# Patient Record
Sex: Male | Born: 2000 | Race: Black or African American | Hispanic: No | Marital: Single | Smoking: Never smoker
Health system: Southern US, Community
[De-identification: ages and names within clinical notes are randomized; demographics above are authoritative.]

---
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
## 2022-06-10 ENCOUNTER — Other Ambulatory Visit: Payer: Self-pay

## 2022-06-10 ENCOUNTER — Ambulatory Visit
Admission: EM | Admit: 2022-06-10 | Discharge: 2022-06-10 | Disposition: A | Discharge status: Home / Self Care | Payer: Medicaid Other | Attending: Nurse Practitioner | Admitting: Nurse Practitioner

## 2022-06-10 ENCOUNTER — Encounter: Payer: Self-pay | Admitting: Emergency Medicine

## 2022-06-10 DIAGNOSIS — Z1152 Encounter for screening for COVID-19: Secondary | ICD-10-CM | POA: Diagnosis present

## 2022-06-10 DIAGNOSIS — B349 Viral infection, unspecified: Secondary | ICD-10-CM | POA: Diagnosis not present

## 2022-06-10 LAB — POCT INFLUENZA A/B
Influenza A, POC: NEGATIVE
Influenza B, POC: NEGATIVE

## 2022-06-10 LAB — POCT RAPID STREP A (OFFICE): Rapid Strep A Screen: NEGATIVE

## 2022-06-10 MED ORDER — PROMETHAZINE-DM 6.25-15 MG/5ML PO SYRP: 5.0000 mL | ORAL_SOLUTION | Freq: Four times a day (QID) | ORAL | 0 refills | Status: DC | PRN

## 2022-06-10 MED ORDER — FLUTICASONE PROPIONATE 50 MCG/ACT NA SUSP: 2.0000 | Freq: Every day | NASAL | 0 refills | Status: DC

## 2022-06-10 NOTE — ED Provider Notes (Signed)
[facility]    CSN: [id number] Arrival date & time: 06/10/22  1843      History   Chief Complaint Chief Complaint  Patient presents with   Nasal Congestion    HPI TREYOR CAVAZOS is a 22 y.o. male.   The history is provided by the patient.   The patient presents for complaints of fever, chills, fatigue, headache, nasal congestion, and cough.  Symptoms have been present for the past 2 days.  Patient denies ear pain, wheezing, abdominal pain, nausea, vomiting, or diarrhea.  Patient reports a recent COVID exposure at work.  He states that he has been taking over-the-counter medications for his symptoms.  History reviewed. No pertinent past medical history.  Patient Active Problem List   Diagnosis Date Noted   Displaced fracture of tibial tuberosity 08/16/2015    Past Surgical History:  Procedure Laterality Date   HARDWARE REMOVAL Left 03/08/2016   Procedure: HARDWARE REMOVAL LEFT KNEE;  Surgeon: Altamese Carter, MD;  Location: [place];  Service: Orthopedics;  Laterality: Left;   OPEN REDUCTION INTERNAL FIXATION (ORIF) TIBIAL TUBERCLE Left 08/16/2015   Procedure: OPEN REDUCTION INTERNAL FIXATION (ORIF) TIBIAL TUBERCLE;  Surgeon: Altamese [place], MD;  Location: [place];  Service: Orthopedics;  Laterality: Left;       Home Medications    Prior to Admission medications   Medication Sig Start Date End Date Taking? Authorizing Provider  fluticasone (FLONASE) 50 MCG/ACT nasal spray Place 2 sprays into both nostrils daily. 06/10/22  Yes Melitta Tigue-Warren, Alda Lea, NP  promethazine-dextromethorphan (PROMETHAZINE-DM) 6.25-15 MG/5ML syrup Take 5 mLs by mouth 4 (four) times daily as needed for cough. 06/10/22  Yes Leinaala Catanese-Warren, Alda Lea, NP  HYDROcodone-acetaminophen (NORCO) 5-325 MG tablet Take 1-2 tablets by mouth every 8 (eight) hours as needed for moderate pain or severe pain. 03/08/16   Ainsley Spinner, PA-C  ibuprofen (ADVIL) 800 MG tablet Take 1 tablet (800 mg total) by mouth 3 (three)  times daily. 02/13/19   Larene Pickett, PA-C    Family History History reviewed. No pertinent family history.  Social History Social History   Tobacco Use   Smoking status: Never   Smokeless tobacco: Never  Substance Use Topics   Alcohol use: No     Allergies   Patient has no known allergies.   Review of Systems Review of Systems Per HPI  Physical Exam Triage Vital Signs ED Triage Vitals  Enc Vitals Group     BP 06/10/22 1853 111/67     Pulse Rate 06/10/22 1853 (!) 57     Resp 06/10/22 1853 20     Temp 06/10/22 1853 98.8 F (37.1 C)     Temp Source 06/10/22 1853 Oral     SpO2 06/10/22 1853 98 %     Weight --      Height --      Head Circumference --      Peak Flow --      Pain Score 06/10/22 1854 6     Pain Loc --      Pain Edu? --      Excl. in [place]? --    No data found.  Updated Vital Signs BP 111/67 (BP Location: Right Arm)   Pulse (!) 57   Temp 98.8 F (37.1 C) (Oral)   Resp 20   SpO2 98%   Visual Acuity Right Eye Distance:   Left Eye Distance:   Bilateral Distance:    Right Eye Near:   Left Eye Near:  Bilateral Near:     Physical Exam Vitals and nursing note reviewed.  Constitutional:      General: He is not in acute distress.    Appearance: Normal appearance.  HENT:     Head: Normocephalic.     Right Ear: Tympanic membrane, ear canal and external ear normal.     Left Ear: Tympanic membrane, ear canal and external ear normal.     Nose: Congestion present.     Mouth/Throat:     Lips: Pink.     Mouth: Mucous membranes are moist.     Pharynx: Uvula midline. Pharyngeal swelling and posterior oropharyngeal erythema present. No oropharyngeal exudate.     Tonsils: 1+ on the right. 1+ on the left.     Comments: Cobblestoning present on posterior oropharynx Eyes:     Extraocular Movements: Extraocular movements intact.     Conjunctiva/sclera: Conjunctivae normal.     Pupils: Pupils are equal, round, and reactive to light.   Cardiovascular:     Rate and Rhythm: Regular rhythm. Bradycardia present.     Pulses: Normal pulses.     Heart sounds: Normal heart sounds.  Pulmonary:     Effort: Pulmonary effort is normal. No respiratory distress.     Breath sounds: Normal breath sounds. No stridor. No wheezing, rhonchi or rales.  Abdominal:     General: Bowel sounds are normal.     Palpations: Abdomen is soft.     Tenderness: There is no abdominal tenderness.  Musculoskeletal:     Cervical back: Normal range of motion.  Lymphadenopathy:     Cervical: No cervical adenopathy.  Skin:    General: Skin is warm and dry.  Neurological:     General: No focal deficit present.     Mental Status: He is alert and oriented to person, place, and time.  Psychiatric:        Mood and Affect: Mood normal.        Behavior: Behavior normal.      UC Treatments / Results  Labs (all labs ordered are listed, but only abnormal results are displayed) Labs Reviewed  SARS CORONAVIRUS 2 (TAT 6-24 HRS)  CULTURE, GROUP A STREP [facility])  POCT INFLUENZA A/B  POCT RAPID STREP A (OFFICE)    EKG   Radiology No results found.  Procedures Procedures (including critical care time)  Medications Ordered in UC Medications - No data to display  Initial Impression / Assessment and Plan / UC Course  I have reviewed the triage vital signs and the nursing notes.  Pertinent labs & imaging results that were available during my care of the patient were reviewed by me and considered in my medical decision making (see chart for details).  The patient is well-appearing, he is in no acute distress, vital signs are stable.  The rapid strep test and influenza test are negative.  A throat culture and COVID test are pending.  Suspect a viral illness at this time.  Provide symptomatic treatment with Promethazine DM for his cough, and fluticasone 50 mcg nasal spray to help with his nasal congestion and runny nose.  Supportive care recommendations  were provided to the patient to include increasing fluids, allowing for plenty of rest, and the counter analgesics for pain, fever, or general discomfort.  Provided information regarding viral illness for the patient and expectation.  Patient was advised when follow-up may be necessary.  Patient verbalizes understanding and is in agreement with this plan of care.  All questions were answered.  Patient stable for discharge.  Work note was provided.   Final Clinical Impressions(s) / UC Diagnoses   Final diagnoses:  Encounter for screening for COVID-19  Viral illness     Discharge Instructions      The rapid strep test and influenza test were negative.  A COVID test is pending.  As discussed, you are a candidate to receive Paxlovid if your COVID test is positive.  You will be contacted if your test is positive, and you also have access to see the result via your MyChart account. Take medication as prescribed. Increase fluids and allow for plenty of rest. Warm salt water gargles 3-4 times daily to help with throat pain or discomfort. Recommend use of a humidifier in your bedroom at nighttime during sleep and sleeping elevated on pillows while cough symptoms persist. Please be advised that a viral illness can last from 7 to 14 days.  If your symptoms suddenly worsen before that time, or extend beyond that time, please follow-up in this clinic or with your primary care physician for further evaluation. Follow-up as needed.      ED Prescriptions     Medication Sig Dispense Auth. Provider   promethazine-dextromethorphan (PROMETHAZINE-DM) 6.25-15 MG/5ML syrup Take 5 mLs by mouth 4 (four) times daily as needed for cough. 118 mL Gunther Zawadzki-Warren, Alda Lea, NP   fluticasone (FLONASE) 50 MCG/ACT nasal spray Place 2 sprays into both nostrils daily. 16 g Fariha Goto-Warren, Alda Lea, NP      PDMP not reviewed this encounter.   Tish Men, NP 06/10/22 1946

## 2022-06-10 NOTE — ED Triage Notes (Signed)
Pt reports had covid exposure at work and reports symptoms started on Wednesday. Pt reports generalized weakness, nasal congestion.

## 2022-06-10 NOTE — Discharge Instructions (Addendum)
The rapid strep test and influenza test were negative.  A COVID test is pending.  As discussed, you are a candidate to receive Paxlovid if your COVID test is positive.  You will be contacted if your test is positive, and you also have access to see the result via your MyChart account. Take medication as prescribed. Increase fluids and allow for plenty of rest. Warm salt water gargles 3-4 times daily to help with throat pain or discomfort. Recommend use of a humidifier in your bedroom at nighttime during sleep and sleeping elevated on pillows while cough symptoms persist. Please be advised that a viral illness can last from 7 to 14 days.  If your symptoms suddenly worsen before that time, or extend beyond that time, please follow-up in this clinic or with your primary care physician for further evaluation. Follow-up as needed.

## 2022-06-11 LAB — SARS CORONAVIRUS 2 (TAT 6-24 HRS): SARS Coronavirus 2: NEGATIVE

## 2022-06-13 LAB — CULTURE, GROUP A STREP (THRC)

## 2022-07-27 ENCOUNTER — Ambulatory Visit
Admission: EM | Admit: 2022-07-27 | Discharge: 2022-07-27 | Disposition: A | Discharge status: Home / Self Care | Payer: Medicaid Other | Attending: Nurse Practitioner | Admitting: Nurse Practitioner

## 2022-07-27 DIAGNOSIS — H6123 Impacted cerumen, bilateral: Secondary | ICD-10-CM

## 2022-07-27 NOTE — ED Provider Notes (Signed)
[facility]    CSN: [id number] Arrival date & time: 07/27/22  1241      History   Chief Complaint No chief complaint on file.   HPI Dwayne Gonzalez is a 22 y.o. male.   Patient presents today for 2-day history of right ear pain.  Reports the pain is currently 7 out of 10 and describes the pain as throbbing.  No recent fever, ear drainage, sore throat, or cough.  No recent water immersion, itching in the ear, or use of Q-tips.  Reports he tried over-the-counter Debrox drops which seem to help, but then symptoms worsen.  Reports his hearing does feel muffled from the right ear.  Reports he has to move his ear forward so he can hear out of the right ear.  Reports history of ceruminosis.      History reviewed. No pertinent past medical history.  Patient Active Problem List   Diagnosis Date Noted   Displaced fracture of tibial tuberosity 08/16/2015    Past Surgical History:  Procedure Laterality Date   HARDWARE REMOVAL Left 03/08/2016   Procedure: HARDWARE REMOVAL LEFT KNEE;  Surgeon: Myrene Galas, MD;  Location: [facility];  Service: Orthopedics;  Laterality: Left;   OPEN REDUCTION INTERNAL FIXATION (ORIF) TIBIAL TUBERCLE Left 08/16/2015   Procedure: OPEN REDUCTION INTERNAL FIXATION (ORIF) TIBIAL TUBERCLE;  Surgeon: Myrene Galas, MD;  Location[facility];  Service: Orthopedics;  Laterality: Left;       Home Medications    Prior to Admission medications   Medication Sig Start Date End Date Taking? Authorizing Provider  fluticasone (FLONASE) 50 MCG/ACT nasal spray Place 2 sprays into both nostrils daily. 06/10/22   Leath-Warren, Sadie Haber, NP  HYDROcodone-acetaminophen (NORCO) 5-325 MG tablet Take 1-2 tablets by mouth every 8 (eight) hours as needed for moderate pain or severe pain. 03/08/16   Montez Morita, PA-C  ibuprofen (ADVIL) 800 MG tablet Take 1 tablet (800 mg total) by mouth 3 (three) times daily. 02/13/19   Garlon Hatchet, PA-C  promethazine-dextromethorphan  (PROMETHAZINE-DM) 6.25-15 MG/5ML syrup Take 5 mLs by mouth 4 (four) times daily as needed for cough. 06/10/22   Leath-Warren, Sadie Haber, NP    Family History History reviewed. No pertinent family history.  Social History Social History   Tobacco Use   Smoking status: Never   Smokeless tobacco: Never  Substance Use Topics   Alcohol use: No     Allergies   Patient has no known allergies.   Review of Systems Review of Systems Per HPI  Physical Exam Triage Vital Signs ED Triage Vitals [07/27/22 1314]  Enc Vitals Group     BP (!) 102/58     Pulse Rate 60     Resp 15     Temp 98.1 F (36.7 C)     Temp Source Oral     SpO2 96 %     Weight      Height      Head Circumference      Peak Flow      Pain Score 7     Pain Loc      Pain Edu?      Excl. in [place]?    No data found.  Updated Vital Signs BP (!) 102/58 (BP Location: Right Arm)   Pulse 60   Temp 98.1 F (36.7 C) (Oral)   Resp 15   SpO2 96%   Visual Acuity Right Eye Distance:   Left Eye Distance:   Bilateral Distance:  Right Eye Near:   Left Eye Near:    Bilateral Near:     Physical Exam Vitals and nursing note reviewed.  Constitutional:      General: He is not in acute distress.    Appearance: Normal appearance. He is not toxic-appearing.  HENT:     Right Ear: There is impacted cerumen.     Left Ear: There is impacted cerumen.     Nose: Nose normal. No congestion or rhinorrhea.     Mouth/Throat:     Mouth: Mucous membranes are moist.     Pharynx: Oropharynx is clear. No oropharyngeal exudate or posterior oropharyngeal erythema.  Eyes:     General: No scleral icterus.    Extraocular Movements: Extraocular movements intact.  Pulmonary:     Effort: Pulmonary effort is normal. No respiratory distress.  Skin:    General: Skin is warm and dry.     Coloration: Skin is not jaundiced or pale.     Findings: No erythema.  Neurological:     Mental Status: He is alert and oriented to person, place,  and time.      UC Treatments / Results  Labs (all labs ordered are listed, but only abnormal results are displayed) Labs Reviewed - No data to display  EKG   Radiology No results found.  Procedures Ear Cerumen Removal  Date/Time: 07/27/2022 3:13 PM  Performed by: Valentino Nose, NP Authorized by: Valentino Nose, NP   Consent:    Consent obtained:  Verbal   Consent given by:  Patient   Risks, benefits, and alternatives were discussed: yes     Risks discussed:  Bleeding, infection, dizziness, pain, TM perforation and incomplete removal   Alternatives discussed:  Alternative treatment Universal protocol:    Procedure explained and questions answered to patient or proxy's satisfaction: yes     Patient identity confirmed:  Verbally with patient Procedure details:    Location:  L ear and R ear   Procedure type: irrigation     Procedure outcomes: cerumen removed   Post-procedure details:    Inspection:  TM intact, no bleeding and ear canal clear   Hearing quality:  Improved   Procedure completion:  Tolerated well, no immediate complications  (including critical care time)  Medications Ordered in UC Medications - No data to display  Initial Impression / Assessment and Plan / UC Course  I have reviewed the triage vital signs and the nursing notes.  Pertinent labs & imaging results that were available during my care of the patient were reviewed by me and considered in my medical decision making (see chart for details).   Patient is well-appearing, normotensive, afebrile, not tachycardic, not tachypneic, oxygenating well on room air.    1. Bilateral impacted cerumen Ear lavage provided as above After lavage, tympanic membranes intact bilaterally and ear canals clear Recommended use of over-the-counter Debrox a few times per year to help prevent cerumen impaction  The patient was given the opportunity to ask questions.  All questions answered to their  satisfaction.  The patient is in agreement to this plan.    Final Clinical Impressions(s) / UC Diagnoses   Final diagnoses:  Bilateral impacted cerumen     Discharge Instructions      We flushed the ear wax out of your ears today.      ED Prescriptions   None    PDMP not reviewed this encounter.   Valentino Nose, NP 07/27/22 [phone]

## 2022-07-27 NOTE — Discharge Instructions (Addendum)
We flushed the ear wax out of your ears today.

## 2022-07-27 NOTE — ED Triage Notes (Signed)
Pt c/o right ear pain, x 2 days throbbing    Pt has taken benadryl to help with symptoms

## 2023-04-27 ENCOUNTER — Ambulatory Visit: Payer: Self-pay

## 2023-05-02 ENCOUNTER — Ambulatory Visit
Admission: RE | Admit: 2023-05-02 | Discharge: 2023-05-02 | Disposition: A | Discharge status: Home / Self Care | Payer: Medicaid Other | Source: Ambulatory Visit | Attending: Family Medicine | Admitting: Family Medicine

## 2023-05-02 VITALS — BP 133/83 | HR 56 | Temp 98.2°F | Resp 18

## 2023-05-02 DIAGNOSIS — Z113 Encounter for screening for infections with a predominantly sexual mode of transmission: Secondary | ICD-10-CM | POA: Diagnosis present

## 2023-05-02 DIAGNOSIS — R300 Dysuria: Secondary | ICD-10-CM | POA: Insufficient documentation

## 2023-05-02 NOTE — ED Triage Notes (Signed)
Pain on urination x 4 days.

## 2023-05-02 NOTE — Discharge Instructions (Signed)
We will let you know if anything comes back abnormal on your swab or blood work.  Drink plenty of fluids and follow-up for significantly worsening symptoms at any time.
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
## 2023-05-03 LAB — CYTOLOGY, (ORAL, ANAL, URETHRAL) ANCILLARY ONLY
Chlamydia: NEGATIVE
Comment: NEGATIVE
Comment: NEGATIVE
Comment: NORMAL
Neisseria Gonorrhea: NEGATIVE
Trichomonas: NEGATIVE

## 2023-05-03 LAB — RPR: RPR Ser Ql: NONREACTIVE

## 2023-05-03 LAB — HIV ANTIBODY (ROUTINE TESTING W REFLEX): HIV Screen 4th Generation wRfx: NONREACTIVE

## 2023-11-10 ENCOUNTER — Encounter: Payer: Self-pay | Admitting: Emergency Medicine

## 2023-11-10 ENCOUNTER — Other Ambulatory Visit: Payer: Self-pay

## 2023-11-10 ENCOUNTER — Ambulatory Visit
Admission: EM | Admit: 2023-11-10 | Discharge: 2023-11-10 | Disposition: A | Discharge status: Home / Self Care | Attending: Nurse Practitioner | Admitting: Nurse Practitioner

## 2023-11-10 DIAGNOSIS — Z113 Encounter for screening for infections with a predominantly sexual mode of transmission: Secondary | ICD-10-CM | POA: Insufficient documentation

## 2023-11-10 NOTE — ED Provider Notes (Signed)
 [facility]    CSN: [id number] Arrival date & time: 11/10/23  1907      History   Chief Complaint Chief Complaint  Patient presents with   SEXUALLY TRANSMITTED DISEASE    HPI Dwayne Gonzalez is a 23 y.o. male.   The history is provided by the patient.   Patient presents for STI testing.  Patient denies symptoms to include penile discharge, dysuria, abdominal pain, pelvic pain, or scrotal/testicular pain/swelling.  Patient denies prior history of STI/STDs.  Patient states he has had 1 male partner in the past 90 days.  History reviewed. No pertinent past medical history.  Patient Active Problem List   Diagnosis Date Noted   Displaced fracture of tibial tuberosity 08/16/2015    Past Surgical History:  Procedure Laterality Date   HARDWARE REMOVAL Left 03/08/2016   Procedure: HARDWARE REMOVAL LEFT KNEE;  Surgeon: Ozell Bruch, MD;  Location: [facility] OR;  Service: Orthopedics;  Laterality: Left;   OPEN REDUCTION INTERNAL FIXATION (ORIF) TIBIAL TUBERCLE Left 08/16/2015   Procedure: OPEN REDUCTION INTERNAL FIXATION (ORIF) TIBIAL TUBERCLE;  Surgeon: Ozell Bruch, MD;  Location: [facility] OR;  Service: Orthopedics;  Laterality: Left;       Home Medications    Prior to Admission medications   Not on File    Family History History reviewed. No pertinent family history.  Social History Social History   Tobacco Use   Smoking status: Never   Smokeless tobacco: Never  Vaping Use   Vaping status: Never Used  Substance Use Topics   Alcohol use: Yes    Comment: occ     Allergies   Patient has no known allergies.   Review of Systems Review of Systems Per HPI  Physical Exam Triage Vital Signs ED Triage Vitals  Encounter Vitals Group     BP 11/10/23 1921 (!) 148/76     Girls Systolic BP Percentile --      Girls Diastolic BP Percentile --      Boys Systolic BP Percentile --      Boys Diastolic BP Percentile --      Pulse Rate 11/10/23 1921 65     Resp  11/10/23 1921 20     Temp 11/10/23 1921 98.3 F (36.8 C)     Temp Source 11/10/23 1921 Oral     SpO2 11/10/23 1921 96 %     Weight --      Height --      Head Circumference --      Peak Flow --      Pain Score 11/10/23 1920 0     Pain Loc --      Pain Education --      Exclude from Growth Chart --    No data found.  Updated Vital Signs BP (!) 148/76 (BP Location: Right Arm)   Pulse 65   Temp 98.3 F (36.8 C) (Oral)   Resp 20   SpO2 96%   Visual Acuity Right Eye Distance:   Left Eye Distance:   Bilateral Distance:    Right Eye Near:   Left Eye Near:    Bilateral Near:     Physical Exam Vitals and nursing note reviewed.  Constitutional:      General: He is not in acute distress.    Appearance: Normal appearance.  HENT:     Head: Normocephalic.     Mouth/Throat:     Mouth: Mucous membranes are moist.  Eyes:     Extraocular Movements: Extraocular  movements intact.     Pupils: Pupils are equal, round, and reactive to light.  Cardiovascular:     Rate and Rhythm: Normal rate and regular rhythm.     Pulses: Normal pulses.     Heart sounds: Normal heart sounds.  Pulmonary:     Effort: Pulmonary effort is normal. No respiratory distress.     Breath sounds: Normal breath sounds. No stridor. No wheezing, rhonchi or rales.  Abdominal:     General: Bowel sounds are normal.     Palpations: Abdomen is soft.  Genitourinary:    Comments: GU exam deferred, self swab performed  Musculoskeletal:     Cervical back: Normal range of motion.  Skin:    General: Skin is warm and dry.  Neurological:     General: No focal deficit present.     Mental Status: He is alert and oriented to person, place, and time.  Psychiatric:        Mood and Affect: Mood normal.        Behavior: Behavior normal.      UC Treatments / Results  Labs (all labs ordered are listed, but only abnormal results are displayed) Labs Reviewed - No data to display  EKG   Radiology No results  found.  Procedures Procedures (including critical care time)  Medications Ordered in UC Medications - No data to display  Initial Impression / Assessment and Plan / UC Course  I have reviewed the triage vital signs and the nursing notes.  Pertinent labs & imaging results that were available during my care of the patient were reviewed by me and considered in my medical decision making (see chart for details).  Cytology swab and HIV/RPR test results are pending.  Supportive care recommendations were provided and discussed with the patient to include refrain from sexual intercourse until test results are received, notifying partners if test results are positive, and refraining from sexual intercourse for an additional 7 days after completing treatment if it is necessary.  Patient is in agreement with this plan of care and verbalizes understanding.  All questions were answered.  Patient stable for discharge.  Final Clinical Impressions(s) / UC Diagnoses   Final diagnoses:  None   Discharge Instructions   None    ED Prescriptions   None    PDMP not reviewed this encounter.   Gilmer Etta PARAS, NP 11/10/23 1942

## 2023-11-10 NOTE — ED Triage Notes (Signed)
 Pt presents to UC for STD screening and denies any pain or symptoms at this time. Reports has been with same partner x3 years. Last unprotected intercourse several days ago.

## 2023-11-10 NOTE — Discharge Instructions (Addendum)
 You will be contacted once your test results are received.  If you have access to MyChart, you will be able to see the results there.  If your results are positive, you will be contacted to discuss treatment. Increase condom use with each sexual encounter. Refrain from sexual intercourse until test results are received. If your test results are positive, you will need to notify all partners. If treatment is necessary, you will need to refrain from sexual intercourse for an additional 7 days after completing treatment. Follow-up as needed.

## 2023-11-11 LAB — HIV ANTIBODY (ROUTINE TESTING W REFLEX): HIV Screen 4th Generation wRfx: NONREACTIVE

## 2023-11-11 LAB — RPR: RPR Ser Ql: NONREACTIVE

## 2023-11-13 LAB — CYTOLOGY, (ORAL, ANAL, URETHRAL) ANCILLARY ONLY
Chlamydia: NEGATIVE
Comment: NEGATIVE
Comment: NEGATIVE
Comment: NORMAL
Neisseria Gonorrhea: NEGATIVE
Trichomonas: NEGATIVE

## 2023-11-19 ENCOUNTER — Telehealth: Admitting: Family

## 2023-11-19 DIAGNOSIS — R109 Unspecified abdominal pain: Secondary | ICD-10-CM

## 2023-11-19 NOTE — Progress Notes (Signed)
  Because abdominal pain, I feel your condition warrants further evaluation and I recommend that you be seen in a face-to-face visit.   NOTE: There will be NO CHARGE for this E-Visit   If you are having a true medical emergency, please call 911.     For an urgent face to face visit, [place] has multiple urgent care centers for your convenience.  Click the link below for the full list of locations and hours, walk-in wait times, appointment scheduling options and driving directions:  Urgent Care - [place], [place], [place], [place], [place], [place]  [place]     Your MyChart E-visit questionnaire answers were reviewed by a board certified advanced clinical practitioner to complete your personal care plan based on your specific symptoms.    Thank you for using e-Visits.

## 2024-04-09 ENCOUNTER — Other Ambulatory Visit: Payer: Self-pay

## 2024-04-09 ENCOUNTER — Ambulatory Visit
Admission: EM | Admit: 2024-04-09 | Discharge: 2024-04-09 | Disposition: A | Discharge status: Home / Self Care | Attending: Nurse Practitioner | Admitting: Nurse Practitioner

## 2024-04-09 DIAGNOSIS — Z113 Encounter for screening for infections with a predominantly sexual mode of transmission: Secondary | ICD-10-CM | POA: Diagnosis not present

## 2024-04-09 DIAGNOSIS — R300 Dysuria: Secondary | ICD-10-CM | POA: Diagnosis not present

## 2024-04-09 DIAGNOSIS — Z202 Contact with and (suspected) exposure to infections with a predominantly sexual mode of transmission: Secondary | ICD-10-CM | POA: Diagnosis not present

## 2024-04-09 LAB — POCT URINE DIPSTICK
Bilirubin, UA: NEGATIVE
Blood, UA: NEGATIVE
Glucose, UA: NEGATIVE mg/dL
Ketones, POC UA: NEGATIVE mg/dL
Leukocytes, UA: NEGATIVE
Nitrite, UA: NEGATIVE
POC PROTEIN,UA: NEGATIVE
Spec Grav, UA: 1.025
Urobilinogen, UA: 0.2 U/dL
pH, UA: 6

## 2024-04-09 MED ORDER — DOXYCYCLINE HYCLATE 100 MG PO TABS: 100.0000 mg | ORAL_TABLET | Freq: Two times a day (BID) | ORAL | 0 refills | Status: AC

## 2024-04-09 NOTE — Discharge Instructions (Addendum)
 The urinalysis was negative. Cytology, HIV, and RPR results are pending.  You will be contacted if the pending test results are abnormal.  You will also access to the results via MyChart. Take medication as prescribed. Refrain from sexual intercourse until your test results have been received. You will need to notify any additional partners if your test results are positive. Refrain from sexual intercourse for an additional 7 days after completing treatment. Increase condom use with each sexual encounter. Follow-up as needed.
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
## 2024-04-09 NOTE — ED Provider Notes (Signed)
 " [facility]    CSN: [id number] Arrival date & time: 04/09/24  9187      History   Chief Complaint Chief Complaint  Patient presents with   S74.5    HPI Dwayne Gonzalez is a 23 y.o. male.   The history is provided by the patient.   Patient presents for STI testing.  Patient reports recent exposure to chlamydia from his current partner.  Patient states that he had unprotected sex on 03/25/2024 and 3 days later, he developed burning, and itching with urination.  He reports 1 male partner in the past 90 days.  He denies penile discharge, pelvic pain, testicular/scrotal pain or swelling, flank pain, or low back pain.  Patient reports that he would like treatment today.  He is also requesting blood work.  History reviewed. No pertinent past medical history.  Patient Active Problem List   Diagnosis Date Noted   Displaced fracture of tibial tuberosity 08/16/2015    Past Surgical History:  Procedure Laterality Date   HARDWARE REMOVAL Left 03/08/2016   Procedure: HARDWARE REMOVAL LEFT KNEE;  Surgeon: Ozell Bruch, MD;  Location: [facility] OR;  Service: Orthopedics;  Laterality: Left;   OPEN REDUCTION INTERNAL FIXATION (ORIF) TIBIAL TUBERCLE Left 08/16/2015   Procedure: OPEN REDUCTION INTERNAL FIXATION (ORIF) TIBIAL TUBERCLE;  Surgeon: Ozell Bruch, MD;  Location: [facility] OR;  Service: Orthopedics;  Laterality: Left;       Home Medications    Prior to Admission medications  Medication Sig Start Date End Date Taking? Authorizing Provider  doxycycline (VIBRA-TABS) 100 MG tablet Take 1 tablet (100 mg total) by mouth 2 (two) times daily for 7 days. 04/09/24 04/16/24 Yes Leath-Warren, Etta PARAS, NP    Family History History reviewed. No pertinent family history.  Social History Social History[1]   Allergies   Patient has no known allergies.   Review of Systems Review of Systems Per HPI  Physical Exam Triage Vital Signs ED Triage Vitals  Encounter Vitals Group     BP  04/09/24 0903 125/79     Girls Systolic BP Percentile --      Girls Diastolic BP Percentile --      Boys Systolic BP Percentile --      Boys Diastolic BP Percentile --      Pulse Rate 04/09/24 0903 (!) 55     Resp 04/09/24 0903 17     Temp 04/09/24 0903 99 F (37.2 C)     Temp Source 04/09/24 0903 Oral     SpO2 04/09/24 0903 97 %     Weight --      Height --      Head Circumference --      Peak Flow --      Pain Score 04/09/24 0902 0     Pain Loc --      Pain Education --      Exclude from Growth Chart --    No data found.  Updated Vital Signs BP 125/79 (BP Location: Right Arm)   Pulse (!) 55   Temp 99 F (37.2 C) (Oral)   Resp 17   SpO2 97%   Visual Acuity Right Eye Distance:   Left Eye Distance:   Bilateral Distance:    Right Eye Near:   Left Eye Near:    Bilateral Near:     Physical Exam Vitals and nursing note reviewed.  Constitutional:      General: He is not in acute distress.    Appearance: Normal  appearance.  HENT:     Head: Normocephalic.     Mouth/Throat:     Mouth: Mucous membranes are moist.  Eyes:     Extraocular Movements: Extraocular movements intact.     Pupils: Pupils are equal, round, and reactive to light.  Cardiovascular:     Rate and Rhythm: Normal rate and regular rhythm.     Pulses: Normal pulses.     Heart sounds: Normal heart sounds.  Pulmonary:     Effort: Pulmonary effort is normal. No respiratory distress.     Breath sounds: Normal breath sounds. No stridor. No wheezing, rhonchi or rales.  Abdominal:     General: Bowel sounds are normal.     Palpations: Abdomen is soft.  Genitourinary:    Comments: GU exam deferred, self swab performed  Musculoskeletal:     Cervical back: Normal range of motion.  Skin:    General: Skin is warm and dry.  Neurological:     General: No focal deficit present.     Mental Status: He is alert and oriented to person, place, and time.  Psychiatric:        Mood and Affect: Mood normal.         Behavior: Behavior normal.      UC Treatments / Results  Labs (all labs ordered are listed, but only abnormal results are displayed) Labs Reviewed  HIV ANTIBODY (ROUTINE TESTING W REFLEX)  SYPHILIS: RPR W/REFLEX TO RPR TITER AND TREPONEMAL ANTIBODIES, TRADITIONAL SCREENING AND DIAGNOSIS ALGORITHM  POCT URINE DIPSTICK  CYTOLOGY, (ORAL, ANAL, URETHRAL) ANCILLARY ONLY    EKG   Radiology No results found.  Procedures Procedures (including critical care time)  Medications Ordered in UC Medications - No data to display  Initial Impression / Assessment and Plan / UC Course  I have reviewed the triage vital signs and the nursing notes.  Pertinent labs & imaging results that were available during my care of the patient were reviewed by me and considered in my medical decision making (see chart for details).  Patient presents for STI testing after exposure to chlamydia.  Urinalysis was negative.  Cytology swab, HIV, and RPR test are pending at this time.  Will treat patient empirically for chlamydia with doxycycline 100 mg.  Supportive care recommendations were provided and discussed with the patient to include refraining from sexual intercourse until test results are received, notifying all partners of any positive test results, and refraining from sex for an additional 7 days after completing treatment.  Patient was in agreement with this plan of care and verbalizes understanding.  All questions were answered.  Patient stable for discharge.   Final Clinical Impressions(s) / UC Diagnoses   Final diagnoses:  Dysuria  Screening examination for sexually transmitted disease  Exposure to chlamydia     Discharge Instructions      The urinalysis was negative. Cytology, HIV, and RPR results are pending.  You will be contacted if the pending test results are abnormal.  You will also access to the results via MyChart. Take medication as prescribed. Refrain from sexual intercourse until  your test results have been received. You will need to notify any additional partners if your test results are positive. Refrain from sexual intercourse for an additional 7 days after completing treatment. Increase condom use with each sexual encounter. Follow-up as needed.     ED Prescriptions     Medication Sig Dispense Auth. Provider   doxycycline (VIBRA-TABS) 100 MG tablet Take 1 tablet (100 mg  total) by mouth 2 (two) times daily for 7 days. 14 tablet Leath-Warren, Etta PARAS, NP      PDMP not reviewed this encounter.    [1]  Social History Tobacco Use   Smoking status: Never   Smokeless tobacco: Never  Vaping Use   Vaping status: Never Used  Substance Use Topics   Alcohol use: Yes    Comment: occ     Gilmer Etta PARAS, NP 04/09/24 [id number]  "

## 2024-04-10 LAB — CYTOLOGY, (ORAL, ANAL, URETHRAL) ANCILLARY ONLY
Chlamydia: NEGATIVE
Comment: NEGATIVE
Comment: NORMAL
Neisseria Gonorrhea: NEGATIVE

## 2024-04-10 LAB — SYPHILIS: RPR W/REFLEX TO RPR TITER AND TREPONEMAL ANTIBODIES, TRADITIONAL SCREENING AND DIAGNOSIS ALGORITHM: RPR Ser Ql: NONREACTIVE

## 2024-04-10 LAB — HIV ANTIBODY (ROUTINE TESTING W REFLEX): HIV Screen 4th Generation wRfx: NONREACTIVE
